# Patient Record
Sex: Female | Born: 1970 | Race: Black or African American | Hispanic: No | Marital: Single | Smoking: Never smoker
Health system: Southern US, Community
[De-identification: ages and names within clinical notes are randomized; demographics above are authoritative.]

## PROBLEM LIST (undated history)

## (undated) DIAGNOSIS — E559 Vitamin D deficiency, unspecified: Secondary | ICD-10-CM

## (undated) DIAGNOSIS — S2239XA Fracture of one rib, unspecified side, initial encounter for closed fracture: Secondary | ICD-10-CM

## (undated) DIAGNOSIS — J939 Pneumothorax, unspecified: Secondary | ICD-10-CM

## (undated) DIAGNOSIS — D241 Benign neoplasm of right breast: Secondary | ICD-10-CM

## (undated) HISTORY — PX: ABDOMINAL HYSTERECTOMY: SHX81

## (undated) HISTORY — PX: FRACTURE SURGERY: SHX138

## (undated) HISTORY — DX: Vitamin D deficiency, unspecified: E55.9

---

## 1998-05-13 ENCOUNTER — Encounter: Admission: RE | Admit: 1998-05-13 | Discharge: 1998-05-13 | Payer: Self-pay | Admitting: *Deleted

## 1999-08-11 ENCOUNTER — Emergency Department (HOSPITAL_COMMUNITY): Admission: EM | Admit: 1999-08-11 | Discharge: 1999-08-11 | Payer: Self-pay | Admitting: Emergency Medicine

## 1999-08-11 ENCOUNTER — Encounter: Payer: Self-pay | Admitting: Emergency Medicine

## 2000-01-19 ENCOUNTER — Other Ambulatory Visit: Admission: RE | Admit: 2000-01-19 | Discharge: 2000-01-19 | Payer: Self-pay | Admitting: *Deleted

## 2000-06-22 ENCOUNTER — Encounter: Payer: Self-pay | Admitting: Emergency Medicine

## 2000-06-22 ENCOUNTER — Emergency Department (HOSPITAL_COMMUNITY): Admission: EM | Admit: 2000-06-22 | Discharge: 2000-06-22 | Payer: Self-pay | Admitting: Emergency Medicine

## 2001-01-09 ENCOUNTER — Emergency Department (HOSPITAL_COMMUNITY): Admission: EM | Admit: 2001-01-09 | Discharge: 2001-01-09 | Payer: Self-pay | Admitting: Emergency Medicine

## 2003-05-23 ENCOUNTER — Other Ambulatory Visit: Admission: RE | Admit: 2003-05-23 | Discharge: 2003-05-23 | Payer: Self-pay | Admitting: Obstetrics and Gynecology

## 2003-06-18 ENCOUNTER — Emergency Department (HOSPITAL_COMMUNITY): Admission: EM | Admit: 2003-06-18 | Discharge: 2003-06-18 | Payer: Self-pay | Admitting: Emergency Medicine

## 2004-07-30 ENCOUNTER — Other Ambulatory Visit: Admission: RE | Admit: 2004-07-30 | Discharge: 2004-07-30 | Payer: Self-pay | Admitting: Obstetrics and Gynecology

## 2004-09-03 ENCOUNTER — Ambulatory Visit (HOSPITAL_COMMUNITY): Admission: RE | Admit: 2004-09-03 | Discharge: 2004-09-03 | Payer: Self-pay | Admitting: Obstetrics and Gynecology

## 2005-01-24 ENCOUNTER — Inpatient Hospital Stay (HOSPITAL_COMMUNITY): Admission: AD | Admit: 2005-01-24 | Discharge: 2005-01-26 | Payer: Self-pay | Admitting: Obstetrics and Gynecology

## 2006-01-17 ENCOUNTER — Other Ambulatory Visit: Admission: RE | Admit: 2006-01-17 | Discharge: 2006-01-17 | Payer: Self-pay | Admitting: Obstetrics and Gynecology

## 2010-06-10 ENCOUNTER — Emergency Department (HOSPITAL_COMMUNITY)
Admission: EM | Admit: 2010-06-10 | Discharge: 2010-06-10 | Payer: Self-pay | Source: Home / Self Care | Admitting: Emergency Medicine

## 2010-12-23 LAB — COMPREHENSIVE METABOLIC PANEL
AST: 17 U/L (ref 0–37)
Albumin: 3.7 g/dL (ref 3.5–5.2)
Alkaline Phosphatase: 59 U/L (ref 39–117)
Chloride: 107 mEq/L (ref 96–112)
GFR calc Af Amer: >60 mL/min (ref >60)
Potassium: 3.6 mEq/L (ref 3.5–5.1)
Total Bilirubin: 0.2 mg/dL — ABNORMAL LOW (ref 0.3–1.2)

## 2010-12-23 LAB — URINALYSIS, ROUTINE W REFLEX MICROSCOPIC
Bilirubin Urine: NEGATIVE
Glucose, UA: NEGATIVE mg/dL
Hgb urine dipstick: NEGATIVE
Ketones, ur: NEGATIVE mg/dL
Nitrite: NEGATIVE
Protein, ur: NEGATIVE mg/dL
Specific Gravity, Urine: 1.022 (ref 1.005–1.030)
Urobilinogen, UA: 1 mg/dL (ref 0.0–1.0)
pH: 5.5 (ref 5.0–8.0)

## 2010-12-23 LAB — URINE CULTURE: Colony Count: 7000

## 2010-12-23 LAB — DIFFERENTIAL
Basophils Relative: 0 % (ref 0–1)
Eosinophils Absolute: 0 10*3/uL (ref 0.0–0.7)
Lymphocytes Relative: 24 % (ref 12–46)
Monocytes Absolute: 0.5 10*3/uL (ref 0.1–1.0)
Neutro Abs: 4 10*3/uL (ref 1.7–7.7)

## 2010-12-23 LAB — WET PREP, GENITAL: Trich, Wet Prep: NONE SEEN

## 2010-12-23 LAB — CBC
Platelets: 192 10*3/uL (ref 150–400)
RBC: 4.01 MIL/uL (ref 3.87–5.11)
WBC: 5.9 10*3/uL (ref 4.0–10.5)

## 2010-12-23 LAB — HEMOCCULT GUIAC POC 1CARD (OFFICE): Fecal Occult Bld: NEGATIVE

## 2012-05-15 ENCOUNTER — Emergency Department: Payer: Self-pay | Admitting: Emergency Medicine

## 2012-05-15 LAB — MAGNESIUM: Magnesium: 1.8 mg/dL

## 2012-05-15 LAB — URINALYSIS, COMPLETE
Bilirubin,UR: NEGATIVE
Glucose,UR: NEGATIVE mg/dL (ref 0–75)
Ketone: NEGATIVE
Specific Gravity: 1.008 (ref 1.003–1.030)
Squamous Epithelial: 1
WBC UR: 1 /HPF (ref 0–5)

## 2012-05-15 LAB — COMPREHENSIVE METABOLIC PANEL
Bilirubin,Total: 0.2 mg/dL (ref 0.2–1.0)
Chloride: 108 mmol/L — ABNORMAL HIGH (ref 98–107)
Co2: 25 mmol/L (ref 21–32)
EGFR (African American): >60
EGFR (Non-African Amer.): >60
Osmolality: 279 (ref 275–301)
Potassium: 3.6 mmol/L (ref 3.5–5.1)

## 2012-05-15 LAB — CBC WITH DIFFERENTIAL/PLATELET
Basophil %: 0.5 %
Eosinophil %: 0.5 %
HGB: 12.8 g/dL (ref 12.0–16.0)
Lymphocyte #: 1.9 10*3/uL (ref 1.0–3.6)
MCH: 31 pg (ref 26.0–34.0)
MCV: 92 fL (ref 80–100)
Monocyte #: 0.4 x10 3/mm (ref 0.2–0.9)
RBC: 4.13 10*6/uL (ref 3.80–5.20)

## 2012-05-15 LAB — PREGNANCY, URINE: Pregnancy Test, Urine: NEGATIVE m[IU]/mL

## 2012-05-21 LAB — CULTURE, BLOOD (SINGLE)

## 2014-06-04 ENCOUNTER — Telehealth: Payer: Self-pay | Admitting: *Deleted

## 2014-06-04 NOTE — Telephone Encounter (Signed)
Patient calling because our name is listed on her Medicaid card.  Lost job and has been approved for [employer].  Currently seeing Dr. Donneta Romberg at [facility] [phone]).  Had heavy vaginal bleeding 1 week ago ("saturated my clothes") and recently had an abnormal endometrial bx.  Scheduled for ultrasound on 06/10/14 at OB/GYN's office.  Patient is wanting to know if we can authorize enough visits for her to be seen today and next Tuesday.  Called and spoke with Alma Friendly at [facility].  Authorized 3 visits (today, 06/10/14, and followup appt) and NPI # given.  Demographic info updated in Epic.  Kennyth Lose will mail patient new patient packet to establish care at [facility] and will schedule appt when patient returns packet to our office.  Patient informed.   Burna Forts, BSN, RN-BC

## 2014-06-10 ENCOUNTER — Ambulatory Visit: Payer: Self-pay | Admitting: Obstetrics and Gynecology

## 2014-07-30 ENCOUNTER — Ambulatory Visit: Payer: Self-pay | Admitting: Obstetrics and Gynecology

## 2014-07-30 LAB — COMPREHENSIVE METABOLIC PANEL
ALBUMIN: 3.1 g/dL — AB (ref 3.4–5.0)
ALK PHOS: 80 U/L
Anion Gap: 5 — ABNORMAL LOW (ref 7–16)
BILIRUBIN TOTAL: 0.2 mg/dL (ref 0.2–1.0)
BUN: 7 mg/dL (ref 7–18)
CHLORIDE: 107 mmol/L (ref 98–107)
CREATININE: 0.72 mg/dL (ref 0.60–1.30)
Calcium, Total: 7.8 mg/dL — ABNORMAL LOW (ref 8.5–10.1)
Co2: 27 mmol/L (ref 21–32)
GLUCOSE: 90 mg/dL (ref 65–99)
OSMOLALITY: 275 (ref 275–301)
Potassium: 4.2 mmol/L (ref 3.5–5.1)
SGOT(AST): 21 U/L (ref 15–37)
SGPT (ALT): 14 U/L
SODIUM: 139 mmol/L (ref 136–145)
TOTAL PROTEIN: 6.7 g/dL (ref 6.4–8.2)

## 2014-07-30 LAB — CBC
HCT: 34.1 % — AB (ref 35.0–47.0)
HGB: 10.8 g/dL — ABNORMAL LOW (ref 12.0–16.0)
MCH: 29.8 pg (ref 26.0–34.0)
MCHC: 31.7 g/dL — ABNORMAL LOW (ref 32.0–36.0)
MCV: 94 fL (ref 80–100)
PLATELETS: 195 10*3/uL (ref 150–440)
RBC: 3.63 10*6/uL — AB (ref 3.80–5.20)
RDW: 12.6 % (ref 11.5–14.5)
WBC: 8.5 10*3/uL (ref 3.6–11.0)

## 2014-08-05 ENCOUNTER — Inpatient Hospital Stay: Payer: Self-pay | Admitting: Obstetrics and Gynecology

## 2014-08-05 LAB — CBC
HCT: 30.7 % — AB (ref 35.0–47.0)
HGB: 9.8 g/dL — ABNORMAL LOW (ref 12.0–16.0)
MCH: 29.3 pg (ref 26.0–34.0)
MCHC: 31.8 g/dL — AB (ref 32.0–36.0)
MCV: 92 fL (ref 80–100)
PLATELETS: 154 10*3/uL (ref 150–440)
RBC: 3.34 10*6/uL — ABNORMAL LOW (ref 3.80–5.20)
RDW: 12 % (ref 11.5–14.5)
WBC: 14 10*3/uL — AB (ref 3.6–11.0)

## 2014-08-05 LAB — COMPREHENSIVE METABOLIC PANEL
ALBUMIN: 2.6 g/dL — AB (ref 3.4–5.0)
ALT: 12 U/L — AB
Alkaline Phosphatase: 60 U/L
Anion Gap: 6 — ABNORMAL LOW (ref 7–16)
BILIRUBIN TOTAL: 0.3 mg/dL (ref 0.2–1.0)
BUN: 6 mg/dL — ABNORMAL LOW (ref 7–18)
CALCIUM: 7.6 mg/dL — AB (ref 8.5–10.1)
CHLORIDE: 106 mmol/L (ref 98–107)
CREATININE: 0.76 mg/dL (ref 0.60–1.30)
Co2: 26 mmol/L (ref 21–32)
EGFR (African American): >60
GLUCOSE: 122 mg/dL — AB (ref 65–99)
OSMOLALITY: 275 (ref 275–301)
Potassium: 4 mmol/L (ref 3.5–5.1)
SGOT(AST): 18 U/L (ref 15–37)
Sodium: 138 mmol/L (ref 136–145)
Total Protein: 5.6 g/dL — ABNORMAL LOW (ref 6.4–8.2)

## 2014-08-06 LAB — BASIC METABOLIC PANEL
Anion Gap: 6 — ABNORMAL LOW (ref 7–16)
BUN: 4 mg/dL — AB (ref 7–18)
CO2: 28 mmol/L (ref 21–32)
Calcium, Total: 7.3 mg/dL — ABNORMAL LOW (ref 8.5–10.1)
Chloride: 108 mmol/L — ABNORMAL HIGH (ref 98–107)
Creatinine: 0.76 mg/dL (ref 0.60–1.30)
Glucose: 108 mg/dL — ABNORMAL HIGH (ref 65–99)
Osmolality: 281 (ref 275–301)
POTASSIUM: 3.6 mmol/L (ref 3.5–5.1)
SODIUM: 142 mmol/L (ref 136–145)

## 2014-08-06 LAB — CBC
HCT: 29.3 % — AB (ref 35.0–47.0)
HGB: 9.4 g/dL — ABNORMAL LOW (ref 12.0–16.0)
MCH: 30 pg (ref 26.0–34.0)
MCHC: 32.2 g/dL (ref 32.0–36.0)
MCV: 93 fL (ref 80–100)
Platelet: 134 10*3/uL — ABNORMAL LOW (ref 150–440)
RBC: 3.15 10*6/uL — AB (ref 3.80–5.20)
RDW: 12.2 % (ref 11.5–14.5)
WBC: 9.3 10*3/uL (ref 3.6–11.0)

## 2014-08-06 LAB — MAGNESIUM: MAGNESIUM: 1.6 mg/dL — AB

## 2014-08-07 LAB — BASIC METABOLIC PANEL
ANION GAP: 4 — AB (ref 7–16)
BUN: 4 mg/dL — AB (ref 7–18)
CHLORIDE: 107 mmol/L (ref 98–107)
CO2: 29 mmol/L (ref 21–32)
Calcium, Total: 7.4 mg/dL — ABNORMAL LOW (ref 8.5–10.1)
Creatinine: 0.85 mg/dL (ref 0.60–1.30)
GLUCOSE: 86 mg/dL (ref 65–99)
Osmolality: 276 (ref 275–301)
POTASSIUM: 3.3 mmol/L — AB (ref 3.5–5.1)
SODIUM: 140 mmol/L (ref 136–145)

## 2014-08-07 LAB — CBC
HCT: 26.9 % — AB (ref 35.0–47.0)
HGB: 8.7 g/dL — AB (ref 12.0–16.0)
MCH: 30.1 pg (ref 26.0–34.0)
MCHC: 32.2 g/dL (ref 32.0–36.0)
MCV: 94 fL (ref 80–100)
Platelet: 113 10*3/uL — ABNORMAL LOW (ref 150–440)
RBC: 2.87 10*6/uL — ABNORMAL LOW (ref 3.80–5.20)
RDW: 12.3 % (ref 11.5–14.5)
WBC: 6.8 10*3/uL (ref 3.6–11.0)

## 2014-08-07 LAB — MAGNESIUM: MAGNESIUM: 1.5 mg/dL — AB

## 2014-11-21 ENCOUNTER — Encounter (HOSPITAL_COMMUNITY): Payer: Self-pay

## 2014-11-21 ENCOUNTER — Emergency Department (HOSPITAL_COMMUNITY)
Admission: EM | Admit: 2014-11-21 | Discharge: 2014-11-21 | Discharge status: Against Medical Advice | Payer: Medicaid Other | Attending: Emergency Medicine | Admitting: Emergency Medicine

## 2014-11-21 DIAGNOSIS — K5900 Constipation, unspecified: Secondary | ICD-10-CM | POA: Insufficient documentation

## 2014-11-21 DIAGNOSIS — M542 Cervicalgia: Secondary | ICD-10-CM | POA: Diagnosis present

## 2014-11-21 HISTORY — DX: Fracture of one rib, unspecified side, initial encounter for closed fracture: S22.39XA

## 2014-11-21 NOTE — ED Notes (Signed)
Pt states she's had a migraine x 7 days.  Pt took 3 Advil yesterday.  Pt is ambulatory without difficulty or assistance.  No neuro deficits.

## 2014-11-21 NOTE — ED Notes (Signed)
Called for pt in lobby to go to room - no answer.

## 2014-11-22 NOTE — ED Provider Notes (Signed)
Pt not seen, left ED waiting room before being brought back.  Leone Brand, MD 11/22/14 [phone]

## 2015-01-31 NOTE — Op Note (Signed)
PATIENT NAME:  Cindy Carlson, Cindy Carlson MR#:  [id number] DATE OF BIRTH:  06-30-1971  DATE OF PROCEDURE:  08/05/2014  PREOPERATIVE DIAGNOSES: 1.  Abnormal uterine bleeding with irregular cycles.  2.  Fibroid uterus.   POSTOPERATIVE DIAGNOSES: 1.  Abnormal uterine bleeding with irregular cycles.  2.  Fibroid uterus.   PROCEDURES: 1.  Laparoscopic converted to total abdominal hysterectomy.  2.  Bilateral salpingectomy.   ANESTHESIA:  General.  SURGEON:  Will Bonnet, MD   ASSISTANT SURGEON:  Atlee Abide, MD   ESTIMATED BLOOD LOSS:  300 mL.   OPERATIVE FLUIDS:  1800 mL of crystalloid.   COMPLICATIONS:  None.   FINDINGS: 1.  Adhesion of omentum to the anterior abdominal wall, especially in the periumbilical region.  2.  Filmy adhesions of the small bowel to the anterior abdominal wall in the midline along prior surgical scar.  3.  Globally enlarged uterus.  4.  Approximately 7 cm posterior fundal fibroid, intramural.  5.  A 4 cm posterior lower uterine segment fibroid, intramural.  6.  Normal-appearing segments of fallopian tubes.  7.  Normal-appearing ovaries.  8.  Small tear in serosa of small bowel during adhesiolysis.  9.  A small left hernia at the entrance of the round ligament to the abdominal fascia approximately 0.75 cm in size with no intra-abdominal contents found in the hernia.   SPECIMENS: 1.  Uterus and cervix.  2.  Bilateral fallopian tubes.   CONDITION AT THE END OF PROCEDURE:  Stable.   PROCEDURE IN DETAIL:  The patient was taken to the operating room, where general anesthesia was administered and found to be adequate. The patient was placed in the dorsal supine lithotomy position in [place] with great care taken in positioning to minimize risk of damage to nerves and blood vessels. The patient was prepped and draped in the usual sterile fashion. After a timeout was called, a sterile speculum was placed in the vagina, and a single-tooth tenaculum was  used to grasp the anterior lip of the cervix. A large V-Care device was affixed to the cervix according to the manufacturer's recommendation after removal of the single-tooth tenaculum. An indwelling catheter was placed in her bladder at this point.   Attention was turned to the abdominal portion of the case, where after injection of local anesthetic, a 5 mm supraumbilical incision was made with a scalpel. Entry into the abdomen was gained using the Optiview trocar method. Verification of entry into the abdomen was accomplished using opening abdominal pressures. The abdomen was insufflated with CO2. The camera was introduced through the entry port, and verification of atraumatic entry was obtained. A left lower quadrant 5 mm port was placed under direct intra-abdominal camera visualization without difficulty. An 11 mm right lower quadrant port was placed under direct intra-abdominal camera visualization without difficulty. The camera was then moved to the left lower quadrant port to assess adhesions around the umbilicus given that there appeared to be some omentum nearby. It was noted that small bowel was traveling relatively close to the trocar. Further inspection revealed no damage to bowel. The camera was moved to the right lower quadrant port, and it was noted that the bowel had dense adhesions to the anterior abdominal wall near the umbilicus and that a particular segment of bowel appeared to be under great tension with the insufflation of the abdomen. Initial attempt was taken to reduce the adhesion well above any bowel. However, during this attempt, a small tear in the serosa  of the bowel was noted. General surgeon, Dr. Sherri Rad, was called in to the operating room to assess need for repair or to assess possibility of an enterotomy. Dr. Marina Gravel felt like there was a small serosal tear of no consequence and no repair would be needed. The rest of the case was then carried forward.   Attention was turned to  the ureters, which were both identified and found to be well away from the operative area of interest. The left utero-ovarian ligament was cauterized using bipolar electrocautery and transected using the Harmonic scalpel. This was carried forward to the round ligament. The anterior leaf of the broad ligament was transected using the Harmonic and down to the level of the internal os of the cervix. A bladder flap was then begun to be developed. Of note, there was a large amount of venous supply, especially to the left side of the uterus. The right utero-ovarian ligament was transected in a similar fashion and carried to the transection of the right round ligament. The anterior leaf of the broad ligament was transected in a similar fashion. There was difficulty in visualization given the large posterior fundal fibroid, so a 30-degree angled scope was used to gain better visualization. The posterior broad leaf was then transected down to the level of the internal os, and the right uterine artery was attempted to be skeletonized and ligated using bipolar electrocautery; however, there was difficulty in gaining adequate visualization and gaining hemostasis. At this point, decision was made to complete the rest of the surgery via open laparotomy given location of the lower uterine segment fibroid in relation to the internal cervical os and concern with a large amount of vasculature in the area and potential for large blood loss laparoscopically.   A Pfannenstiel incision was made with a scalpel and carried through the various layers until the peritoneum was identified and opened sharply. This was done while the abdomen remained insufflated. The uterus served as a protector of the bowel so the bowel was able to maintain retraction from the operative field of interest without a self-retaining retractor. The right uterine artery at this point was better skeletonized. The bladder flap was created more completely. In  sequential steps, the right uterine artery was skeletonized, transected, and in sequential clamp cut and tie technique, the vessels along the lower uterine segment and the cervix were ligated and transected. Curved clamps were placed across the lower edge of the cervix and using Jorgenson scissors, the uterus, cervix, and fibroids were then liberated from their connection into the vagina, and the uterus was removed from the abdomen. The cervical cuff was closed using interrupted figure-of-eight sutures with 0 Vicryl with hemostasis verified. The vascular pedicles were verified to be hemostatic. The abdomen was monitored for several minutes to ensure that no additional bleeding would occur, and no additional bleeding did occur. The segments of fallopian tubes that were remaining were then ligated using the Harmonic, and the pedicles along the mesosalpinx were verified to be hemostatic.   The pelvis was copiously irrigated using normal saline at body temperature. The peritoneum was closed using #0 Vicryl in a running fashion.   The On-Q catheters were placed according to the manufacturer's recommendations. They were situated approximately 4 cm cephalad to the incision line, approximately 1 cm apart, straddling the midline. They were inserted to a depth of approximately the third marking on the catheter and positioned just superficial to the rectus abdominis muscles and just deep to the rectus fascia.  After verification of hemostasis of the rectus abdominis muscles, the rectus fascia was closed using #0 Maxon with two separate sutures each, each starting at the lateral apices and meeting in the midline, where they were tied together. For minimization of tension along the skin closure, 3-0 Vicryl stitches were thrown, one in the midline, one between the midline and the left lateral apex, and one midway between the midline and the right lateral apex. The skin was closed using 4-0 Monocryl in a subcuticular fashion.  The On-Q catheters were affixed to the skin using Dermabond as well as Steri-Strips and Tegaderm. All trocars had been removed at this point. Prior to removal of ports verification of no bowel contents entering the port sites was achieved. None could be verified. The right lower quadrant 11 mm port skin site was closed using 4-0 Vicryl in a subcuticular fashion. All skin sites were then reapproximated using Dermabond.   The patient tolerated the procedure well. Sponge, lap, and needle counts were correct x 2. For VTE prophylaxis, the patient was wearing pneumatic compression stockings throughout the entire procedure. For antibiotic prophylaxis, she received 2 grams of Ancef prior to skin incision. The patient was awakened in the OR and transferred to the recovery area in stable condition.    ____________________________ Will Bonnet, MD sdj:nb D: 08/05/2014 19:42:18 ET T: 08/06/2014 06:03:53 ET JOB#: [id number]  cc: Will Bonnet, MD, <Dictator> Will Bonnet MD ELECTRONICALLY SIGNED 08/21/2014 11:04

## 2015-02-02 LAB — SURGICAL PATHOLOGY

## 2015-04-11 ENCOUNTER — Emergency Department (HOSPITAL_COMMUNITY): Payer: No Typology Code available for payment source

## 2015-04-11 ENCOUNTER — Encounter (HOSPITAL_COMMUNITY): Payer: Self-pay | Admitting: *Deleted

## 2015-04-11 ENCOUNTER — Emergency Department (HOSPITAL_COMMUNITY)
Admission: EM | Admit: 2015-04-11 | Discharge: 2015-04-11 | Disposition: A | Discharge status: Home / Self Care | Payer: No Typology Code available for payment source | Attending: Emergency Medicine | Admitting: Emergency Medicine

## 2015-04-11 DIAGNOSIS — N8320 Unspecified ovarian cysts: Secondary | ICD-10-CM | POA: Insufficient documentation

## 2015-04-11 DIAGNOSIS — S0990XA Unspecified injury of head, initial encounter: Secondary | ICD-10-CM | POA: Diagnosis not present

## 2015-04-11 DIAGNOSIS — Z8781 Personal history of (healed) traumatic fracture: Secondary | ICD-10-CM | POA: Insufficient documentation

## 2015-04-11 DIAGNOSIS — R51 Headache: Secondary | ICD-10-CM

## 2015-04-11 DIAGNOSIS — S3991XA Unspecified injury of abdomen, initial encounter: Secondary | ICD-10-CM | POA: Insufficient documentation

## 2015-04-11 DIAGNOSIS — Y92410 Unspecified street and highway as the place of occurrence of the external cause: Secondary | ICD-10-CM | POA: Diagnosis not present

## 2015-04-11 DIAGNOSIS — S20219A Contusion of unspecified front wall of thorax, initial encounter: Secondary | ICD-10-CM | POA: Diagnosis not present

## 2015-04-11 DIAGNOSIS — R1030 Lower abdominal pain, unspecified: Secondary | ICD-10-CM

## 2015-04-11 DIAGNOSIS — R519 Headache, unspecified: Secondary | ICD-10-CM

## 2015-04-11 DIAGNOSIS — N83209 Unspecified ovarian cyst, unspecified side: Secondary | ICD-10-CM

## 2015-04-11 DIAGNOSIS — S299XXA Unspecified injury of thorax, initial encounter: Secondary | ICD-10-CM | POA: Diagnosis present

## 2015-04-11 DIAGNOSIS — Y9389 Activity, other specified: Secondary | ICD-10-CM | POA: Insufficient documentation

## 2015-04-11 DIAGNOSIS — Y998 Other external cause status: Secondary | ICD-10-CM | POA: Diagnosis not present

## 2015-04-11 LAB — CBC WITH DIFFERENTIAL/PLATELET
Basophils Absolute: 0 10*3/uL (ref 0.0–0.1)
Basophils Relative: 0 % (ref 0–1)
EOS ABS: 0 10*3/uL (ref 0.0–0.7)
EOS PCT: 0 % (ref 0–5)
HEMATOCRIT: 39.7 % (ref 36.0–46.0)
HEMOGLOBIN: 13.1 g/dL (ref 12.0–15.0)
LYMPHS PCT: 31 % (ref 12–46)
Lymphs Abs: 2.2 10*3/uL (ref 0.7–4.0)
MCH: 29.8 pg (ref 26.0–34.0)
MCHC: 33 g/dL (ref 30.0–36.0)
MCV: 90.2 fL (ref 78.0–100.0)
Monocytes Absolute: 0.5 10*3/uL (ref 0.1–1.0)
Monocytes Relative: 6 % (ref 3–12)
NEUTROS ABS: 4.4 10*3/uL (ref 1.7–7.7)
Neutrophils Relative %: 63 % (ref 43–77)
PLATELETS: 166 10*3/uL (ref 150–400)
RBC: 4.4 MIL/uL (ref 3.87–5.11)
RDW: 12.9 % (ref 11.5–15.5)
WBC: 7 10*3/uL (ref 4.0–10.5)

## 2015-04-11 LAB — I-STAT CHEM 8, ED
BUN: 8 mg/dL (ref 6–20)
Calcium, Ion: 1.21 mmol/L (ref 1.12–1.23)
Chloride: 103 mmol/L (ref 101–111)
Creatinine, Ser: 0.8 mg/dL (ref 0.44–1.00)
Glucose, Bld: 86 mg/dL (ref 65–99)
HCT: 43 % (ref 36.0–46.0)
Hemoglobin: 14.6 g/dL (ref 12.0–15.0)
POTASSIUM: 4 mmol/L (ref 3.5–5.1)
SODIUM: 138 mmol/L (ref 135–145)
TCO2: 24 mmol/L (ref 0–100)

## 2015-04-11 MED ORDER — NAPROXEN 500 MG PO TABS: 500.0000 mg | ORAL_TABLET | Freq: Two times a day (BID) | ORAL | Status: DC

## 2015-04-11 MED ORDER — IOHEXOL 300 MG/ML  SOLN
100.0000 mL | Freq: Once | INTRAMUSCULAR | Status: AC | PRN
  Administered 2015-04-11: 100 mL via INTRAVENOUS

## 2015-04-11 MED ORDER — KETOROLAC TROMETHAMINE 30 MG/ML IJ SOLN
30.0000 mg | Freq: Once | INTRAMUSCULAR | Status: AC
  Administered 2015-04-11: 30 mg via INTRAVENOUS
  Filled 2015-04-11: qty 1
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
## 2015-04-11 NOTE — ED Notes (Signed)
Pt transporting to xray and CT.
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
## 2016-01-30 ENCOUNTER — Emergency Department
Admission: EM | Admit: 2016-01-30 | Discharge: 2016-01-30 | Disposition: A | Discharge status: Home / Self Care | Payer: Commercial Managed Care - PPO | Attending: Emergency Medicine | Admitting: Emergency Medicine

## 2016-01-30 ENCOUNTER — Emergency Department: Payer: Commercial Managed Care - PPO

## 2016-01-30 DIAGNOSIS — T148 Other injury of unspecified body region: Secondary | ICD-10-CM | POA: Diagnosis not present

## 2016-01-30 DIAGNOSIS — Y939 Activity, unspecified: Secondary | ICD-10-CM | POA: Diagnosis not present

## 2016-01-30 DIAGNOSIS — Y92410 Unspecified street and highway as the place of occurrence of the external cause: Secondary | ICD-10-CM | POA: Diagnosis not present

## 2016-01-30 DIAGNOSIS — Y999 Unspecified external cause status: Secondary | ICD-10-CM | POA: Diagnosis not present

## 2016-01-30 DIAGNOSIS — T148XXA Other injury of unspecified body region, initial encounter: Secondary | ICD-10-CM

## 2016-01-30 DIAGNOSIS — M25562 Pain in left knee: Secondary | ICD-10-CM | POA: Diagnosis present

## 2016-01-30 HISTORY — DX: Pneumothorax, unspecified: J93.9

## 2016-01-30 LAB — CBC WITH DIFFERENTIAL/PLATELET
Basophils Absolute: 0 10*3/uL (ref 0–0.1)
Basophils Relative: 1 %
EOS PCT: 0 %
Eosinophils Absolute: 0 10*3/uL (ref 0–0.7)
HCT: 39.6 % (ref 35.0–47.0)
Hemoglobin: 13.2 g/dL (ref 12.0–16.0)
LYMPHS ABS: 1.3 10*3/uL (ref 1.0–3.6)
LYMPHS PCT: 23 %
MCH: 30.8 pg (ref 26.0–34.0)
MCHC: 33.3 g/dL (ref 32.0–36.0)
MCV: 92.3 fL (ref 80.0–100.0)
MONO ABS: 0.4 10*3/uL (ref 0.2–0.9)
Monocytes Relative: 7 %
Neutro Abs: 4.1 10*3/uL (ref 1.4–6.5)
Neutrophils Relative %: 69 %
Platelets: 152 10*3/uL (ref 150–440)
RBC: 4.29 MIL/uL (ref 3.80–5.20)
RDW: 13.4 % (ref 11.5–14.5)
WBC: 5.8 10*3/uL (ref 3.6–11.0)

## 2016-01-30 LAB — COMPREHENSIVE METABOLIC PANEL
ALK PHOS: 59 U/L (ref 38–126)
ALT: 24 U/L (ref 14–54)
AST: 23 U/L (ref 15–41)
Albumin: 4 g/dL (ref 3.5–5.0)
Anion gap: 4 — ABNORMAL LOW (ref 5–15)
BUN: 14 mg/dL (ref 6–20)
CALCIUM: 8.9 mg/dL (ref 8.9–10.3)
CHLORIDE: 107 mmol/L (ref 101–111)
CO2: 27 mmol/L (ref 22–32)
CREATININE: 0.83 mg/dL (ref 0.44–1.00)
GFR calc non Af Amer: >60 mL/min (ref >60)
GLUCOSE: 98 mg/dL (ref 65–99)
Potassium: 4.6 mmol/L (ref 3.5–5.1)
Sodium: 138 mmol/L (ref 135–145)
Total Bilirubin: 0.5 mg/dL (ref 0.3–1.2)
Total Protein: 6.9 g/dL (ref 6.5–8.1)

## 2016-01-30 LAB — HCG, QUANTITATIVE, PREGNANCY: hCG, Beta Chain, Quant, S: <1 m[IU]/mL (ref <5)

## 2016-01-30 MED ORDER — KETOROLAC TROMETHAMINE 30 MG/ML IJ SOLN
30.0000 mg | Freq: Once | INTRAMUSCULAR | Status: AC
  Administered 2016-01-30: 30 mg via INTRAVENOUS
  Filled 2016-01-30: qty 1

## 2016-01-30 MED ORDER — IOPAMIDOL (ISOVUE-300) INJECTION 61%
100.0000 mL | Freq: Once | INTRAVENOUS | Status: AC | PRN
  Administered 2016-01-30: 100 mL via INTRAVENOUS

## 2016-01-30 MED ORDER — DIAZEPAM 5 MG/ML IJ SOLN
5.0000 mg | Freq: Once | INTRAMUSCULAR | Status: AC
  Administered 2016-01-30: 5 mg via INTRAVENOUS
  Filled 2016-01-30: qty 2

## 2016-01-30 MED ORDER — CYCLOBENZAPRINE HCL 10 MG PO TABS: 10.0000 mg | ORAL_TABLET | Freq: Three times a day (TID) | ORAL | Status: AC | PRN

## 2016-01-30 MED ORDER — IBUPROFEN 800 MG PO TABS: 800.0000 mg | ORAL_TABLET | Freq: Three times a day (TID) | ORAL | Status: DC | PRN

## 2016-01-30 NOTE — ED Notes (Signed)
Report to Spencer, RN
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
## 2016-01-30 NOTE — Discharge Instructions (Signed)

## 2016-03-29 ENCOUNTER — Ambulatory Visit (INDEPENDENT_AMBULATORY_CARE_PROVIDER_SITE_OTHER): Payer: Commercial Managed Care - PPO | Admitting: Emergency Medicine

## 2016-03-29 VITALS — BP 102/68 | HR 110 | Temp 98.8°F | Resp 18 | Ht 66.0 in | Wt 135.0 lb

## 2016-03-29 DIAGNOSIS — J029 Acute pharyngitis, unspecified: Secondary | ICD-10-CM

## 2016-03-29 LAB — POCT RAPID STREP A (OFFICE): Rapid Strep A Screen: NEGATIVE

## 2016-03-29 MED ORDER — FIRST-DUKES MOUTHWASH MT SUSP: OROMUCOSAL | Status: DC

## 2016-03-29 MED ORDER — AMOXICILLIN 875 MG PO TABS: 875.0000 mg | ORAL_TABLET | Freq: Two times a day (BID) | ORAL | Status: DC
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
## 2016-03-29 NOTE — Patient Instructions (Addendum)
     IF you received an x-ray today, you will receive an invoice from [facility]. Please contact [facility] at [phone] with questions or concerns regarding your invoice.   IF you received labwork today, you will receive an invoice from [employer]. Please contact [facility] at [phone] with questions or concerns regarding your invoice.   Our billing staff will not be able to assist you with questions regarding bills from these companies.  You will be contacted with the lab results as soon as they are available. The fastest way to get your results is to activate your My Chart account. Instructions are located on the last page of this paperwork. If you have not heard from [place] regarding the results in 2 weeks, please contact this office.     Sore throatSore Throat A sore throat is pain, burning, irritation, or scratchiness of the throat. There is often pain or tenderness when swallowing or talking. A sore throat may be accompanied by other symptoms, such as coughing, sneezing, fever, and swollen neck glands. A sore throat is often the first sign of another sickness, such as a cold, flu, strep throat, or mononucleosis (commonly known as mono). Most sore throats go away without medical treatment. CAUSES  The most common causes of a sore throat include:  A viral infection, such as a cold, flu, or mono.  A bacterial infection, such as strep throat, tonsillitis, or whooping cough.  Seasonal allergies.  Dryness in the air.  Irritants, such as smoke or pollution.  Gastroesophageal reflux disease (GERD). HOME CARE INSTRUCTIONS   Only take over-the-counter medicines as directed by your caregiver.  Drink enough fluids to keep your urine clear or pale yellow.  Rest as needed.  Try using throat sprays, lozenges, or sucking on hard candy to ease any pain (if older than 4 years or as directed).  Sip warm liquids, such as broth, herbal tea, or  warm water with honey to relieve pain temporarily. You may also eat or drink cold or frozen liquids such as frozen ice pops.  Gargle with salt water (mix 1 tsp salt with 8 oz of water).  Do not smoke and avoid secondhand smoke.  Put a cool-mist humidifier in your bedroom at night to moisten the air. You can also turn on a hot shower and sit in the bathroom with the door closed for 5-10 minutes. SEEK IMMEDIATE MEDICAL CARE IF:  You have difficulty breathing.  You are unable to swallow fluids, soft foods, or your saliva.  You have increased swelling in the throat.  Your sore throat does not get better in 7 days.  You have nausea and vomiting.  You have a fever or persistent symptoms for more than 2-3 days.  You have a fever and your symptoms suddenly get worse. MAKE SURE YOU:   Understand these instructions.  Will watch your condition.  Will get help right away if you are not doing well or get worse.   This information is not intended to replace advice given to you by your health care provider. Make sure you discuss any questions you have with your health care provider.   Document Released: 11/03/2004 Document Revised: 10/17/2014 Document Reviewed: 06/03/2012 Elsevier Interactive Patient Education [employer].

## 2016-03-31 LAB — CULTURE, GROUP A STREP: ORGANISM ID, BACTERIA: NORMAL

## 2016-05-23 ENCOUNTER — Other Ambulatory Visit: Payer: Self-pay | Admitting: Obstetrics and Gynecology

## 2016-05-23 DIAGNOSIS — Z803 Family history of malignant neoplasm of breast: Secondary | ICD-10-CM

## 2016-05-23 DIAGNOSIS — N644 Mastodynia: Secondary | ICD-10-CM

## 2016-05-25 ENCOUNTER — Other Ambulatory Visit: Payer: Self-pay | Admitting: *Deleted

## 2016-05-25 ENCOUNTER — Inpatient Hospital Stay
Admission: RE | Admit: 2016-05-25 | Discharge: 2016-05-25 | Disposition: A | Discharge status: Home / Self Care | Payer: Self-pay | Source: Ambulatory Visit | Attending: *Deleted | Admitting: *Deleted

## 2016-05-25 DIAGNOSIS — Z9289 Personal history of other medical treatment: Secondary | ICD-10-CM

## 2016-06-08 ENCOUNTER — Other Ambulatory Visit: Payer: Commercial Managed Care - PPO

## 2016-06-08 ENCOUNTER — Ambulatory Visit: Payer: Commercial Managed Care - PPO

## 2016-06-27 ENCOUNTER — Other Ambulatory Visit: Payer: Commercial Managed Care - PPO

## 2016-06-27 ENCOUNTER — Ambulatory Visit: Payer: Commercial Managed Care - PPO | Attending: Obstetrics and Gynecology

## 2016-12-21 ENCOUNTER — Other Ambulatory Visit: Payer: Self-pay | Admitting: Obstetrics and Gynecology

## 2016-12-21 ENCOUNTER — Encounter: Payer: Self-pay | Admitting: Obstetrics and Gynecology

## 2016-12-21 DIAGNOSIS — E559 Vitamin D deficiency, unspecified: Secondary | ICD-10-CM

## 2016-12-21 HISTORY — DX: Vitamin D deficiency, unspecified: E55.9

## 2016-12-26 ENCOUNTER — Ambulatory Visit
Admission: RE | Admit: 2016-12-26 | Discharge: 2016-12-26 | Disposition: A | Discharge status: Home / Self Care | Payer: Commercial Managed Care - PPO | Source: Ambulatory Visit | Attending: Obstetrics and Gynecology | Admitting: Obstetrics and Gynecology

## 2016-12-26 DIAGNOSIS — N644 Mastodynia: Secondary | ICD-10-CM

## 2016-12-26 DIAGNOSIS — Z803 Family history of malignant neoplasm of breast: Secondary | ICD-10-CM | POA: Insufficient documentation

## 2017-07-28 ENCOUNTER — Ambulatory Visit (INDEPENDENT_AMBULATORY_CARE_PROVIDER_SITE_OTHER): Payer: Commercial Managed Care - PPO | Admitting: Obstetrics and Gynecology

## 2017-07-28 ENCOUNTER — Encounter: Payer: Self-pay | Admitting: Obstetrics and Gynecology

## 2017-07-28 VITALS — BP 110/66 | HR 89 | Wt 134.0 lb

## 2017-07-28 DIAGNOSIS — N6315 Unspecified lump in the right breast, overlapping quadrants: Secondary | ICD-10-CM

## 2017-07-28 DIAGNOSIS — N6310 Unspecified lump in the right breast, unspecified quadrant: Secondary | ICD-10-CM

## 2017-07-28 NOTE — Progress Notes (Signed)
Obstetrics & Gynecology Office Visit   Chief Complaint:  Chief Complaint  Patient presents with  . breast exam    breast knot right side x few days    History of Present Illness: Breast Mass: Patient presents for evaluation of a breast mass. Change was noted 5 days ago. Patient does routinely do self breast exams.  Patient has noted a change on breast exam. Breast cancer risk factors include family hx on mother's side.   Age of menarche was 46. Patient denies hormonal therapy. Patient is [id number]. Age of first live birth was 46 years. Patient did breast feed. Patient denies nipple discharge. Patient denies to previous breast biopsy. Patient denies a personal history of breast cancer.  Past Medical History:  Diagnosis Date  . Pneumothorax   . Rib fracture   . Vitamin D deficiency 12/21/2016    Past Surgical History:  Procedure Laterality Date  . ABDOMINAL HYSTERECTOMY    . FRACTURE SURGERY      Gynecologic History: No LMP recorded. Patient has had a hysterectomy.  Obstetric History: No obstetric history on file.  Family History  Problem Relation Age of Onset  . Breast cancer Mother 83    Social History   Social History  . Marital status: Single    Spouse name: N/A  . Number of children: N/A  . Years of education: N/A   Occupational History  . Not on file.   Social History Main Topics  . Smoking status: Never Smoker  . Smokeless tobacco: Never Used  . Alcohol use No  . Drug use: No  . Sexual activity: Not Currently    Birth control/ protection: Surgical   Other Topics Concern  . Not on file   Social History Narrative  . No narrative on file    Allergies  Allergen Reactions  . Milk-Related Compounds     Prior to Admission medications   Medication Sig Start Date End Date Taking? Authorizing Provider  EPINEPHrine 0.3 mg/0.3 mL IJ SOAJ injection Inject 0.3 mg into the skin. 04/27/15  Yes [provider]    Review of Systems  Constitutional:  Negative for chills, diaphoresis, fever, malaise/fatigue and weight loss.  Respiratory: Negative for cough, hemoptysis, sputum production, shortness of breath and wheezing.   Cardiovascular: Positive for chest pain (at breast lump). Negative for palpitations, orthopnea, claudication, leg swelling and PND.  Gastrointestinal: Negative for abdominal pain, blood in stool, constipation, diarrhea, heartburn, melena and nausea.  Skin: Negative for itching and rash.  Neurological: Negative for dizziness, tingling, sensory change, speech change, focal weakness, seizures, loss of consciousness, weakness and headaches.     Physical Exam BP 110/66 (BP Location: Left Arm, Patient Position: Sitting, Cuff Size: Normal)   Pulse 89   Wt 134 lb (60.8 kg)   BMI 21.63 kg/m  No LMP recorded. Patient has had a hysterectomy. Physical Exam  Constitutional: She is oriented to person, place, and time. She appears well-developed and well-nourished. No distress.  HENT:  Head: Normocephalic and atraumatic.  Eyes: Conjunctivae are normal. No scleral icterus.  Cardiovascular: Normal rate, regular rhythm and normal heart sounds.   Pulmonary/Chest: Effort normal and breath sounds normal. No respiratory distress. She has no wheezes. She has no rales. Right breast exhibits mass (at 3 o'clock on right breast, 3 cm from right nipple, it is about 0.5 x 1 cm, mobile, tender, borders regular) and tenderness (over lump and axilla). Right breast exhibits no inverted nipple, no nipple discharge and no skin change. Left  breast exhibits tenderness (over axilla). Left breast exhibits no inverted nipple, no mass, no nipple discharge and no skin change.    Abdominal: Soft. Bowel sounds are normal.  Musculoskeletal: Normal range of motion. She exhibits no edema.  Neurological: She is alert and oriented to person, place, and time. No cranial nerve deficit.  Skin: Skin is warm and dry. No rash noted. No erythema.  Psychiatric: She has a  normal mood and affect. Her behavior is normal. Judgment normal.   Female chaperone present for pelvic and breast  portions of the physical exam  Assessment: 46 y.o. No obstetric history on file. female here for  1. Breast lump on right side at 3 o'clock position      Plan: Problem List Items Addressed This Visit    None    Visit Diagnoses    Breast lump on right side at 3 o'clock position    -  Primary   Relevant Orders   MM DIAG BREAST TOMO UNI RIGHT   US BREAST LTD UNI RIGHT INC AXILLA     Breast imaging ordered for stat.  Characteristics are overall benign. However, patient's mother had breast cancer at age 44y.  Her last breast imaging was 7 months ago and was normal.    [name], MD 07/28/2017 3:10 PM

## 2017-07-31 ENCOUNTER — Other Ambulatory Visit: Payer: Commercial Managed Care - PPO

## 2017-08-03 ENCOUNTER — Other Ambulatory Visit: Payer: Commercial Managed Care - PPO

## 2017-08-04 ENCOUNTER — Other Ambulatory Visit: Payer: Self-pay

## 2017-08-04 ENCOUNTER — Encounter: Payer: Self-pay | Admitting: Obstetrics and Gynecology

## 2017-08-07 ENCOUNTER — Other Ambulatory Visit: Payer: Self-pay | Admitting: Obstetrics and Gynecology

## 2017-08-07 DIAGNOSIS — N63 Unspecified lump in unspecified breast: Secondary | ICD-10-CM

## 2017-08-17 ENCOUNTER — Other Ambulatory Visit: Payer: Self-pay | Admitting: Obstetrics and Gynecology

## 2017-08-17 ENCOUNTER — Inpatient Hospital Stay: Admission: RE | Admit: 2017-08-17 | Payer: Commercial Managed Care - PPO | Source: Ambulatory Visit

## 2017-08-17 ENCOUNTER — Ambulatory Visit
Admission: RE | Admit: 2017-08-17 | Discharge: 2017-08-17 | Disposition: A | Discharge status: Home / Self Care | Payer: Commercial Managed Care - PPO | Source: Ambulatory Visit | Attending: Obstetrics and Gynecology | Admitting: Obstetrics and Gynecology

## 2017-08-17 DIAGNOSIS — N63 Unspecified lump in unspecified breast: Secondary | ICD-10-CM

## 2017-08-21 ENCOUNTER — Ambulatory Visit
Admission: RE | Admit: 2017-08-21 | Discharge: 2017-08-21 | Disposition: A | Discharge status: Home / Self Care | Payer: Commercial Managed Care - PPO | Source: Ambulatory Visit | Attending: Obstetrics and Gynecology | Admitting: Obstetrics and Gynecology

## 2017-08-21 DIAGNOSIS — N63 Unspecified lump in unspecified breast: Secondary | ICD-10-CM

## 2017-09-06 ENCOUNTER — Other Ambulatory Visit: Payer: Self-pay | Admitting: General Surgery

## 2017-09-06 DIAGNOSIS — D241 Benign neoplasm of right breast: Secondary | ICD-10-CM

## 2017-09-08 ENCOUNTER — Other Ambulatory Visit: Payer: Self-pay | Admitting: General Surgery

## 2017-09-08 DIAGNOSIS — D241 Benign neoplasm of right breast: Secondary | ICD-10-CM

## 2017-09-14 ENCOUNTER — Encounter (HOSPITAL_BASED_OUTPATIENT_CLINIC_OR_DEPARTMENT_OTHER): Payer: Self-pay | Admitting: *Deleted
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
## 2017-09-20 ENCOUNTER — Inpatient Hospital Stay: Admission: RE | Admit: 2017-09-20 | Payer: Commercial Managed Care - PPO | Source: Ambulatory Visit

## 2017-09-20 ENCOUNTER — Other Ambulatory Visit: Payer: Self-pay | Admitting: General Surgery

## 2017-09-20 ENCOUNTER — Ambulatory Visit
Admission: RE | Admit: 2017-09-20 | Discharge: 2017-09-20 | Disposition: A | Discharge status: Home / Self Care | Payer: Commercial Managed Care - PPO | Source: Ambulatory Visit | Attending: General Surgery | Admitting: General Surgery

## 2017-09-20 DIAGNOSIS — D241 Benign neoplasm of right breast: Secondary | ICD-10-CM

## 2017-09-20 NOTE — Progress Notes (Signed)
Ensure pre surgery drink given with instructions to complete by 0800 dos, pt verbalized understanding. 

## 2017-09-22 ENCOUNTER — Ambulatory Visit (HOSPITAL_BASED_OUTPATIENT_CLINIC_OR_DEPARTMENT_OTHER)
Admission: RE | Admit: 2017-09-22 | Discharge: 2017-09-22 | Disposition: A | Discharge status: Home / Self Care | Payer: Commercial Managed Care - PPO | Source: Ambulatory Visit | Attending: General Surgery | Admitting: General Surgery

## 2017-09-22 ENCOUNTER — Ambulatory Visit (HOSPITAL_BASED_OUTPATIENT_CLINIC_OR_DEPARTMENT_OTHER): Payer: Commercial Managed Care - PPO | Admitting: Anesthesiology

## 2017-09-22 ENCOUNTER — Encounter (HOSPITAL_BASED_OUTPATIENT_CLINIC_OR_DEPARTMENT_OTHER)
Admission: RE | Disposition: A | Discharge status: Home / Self Care | Payer: Self-pay | Source: Ambulatory Visit | Attending: General Surgery

## 2017-09-22 ENCOUNTER — Encounter (HOSPITAL_BASED_OUTPATIENT_CLINIC_OR_DEPARTMENT_OTHER): Payer: Self-pay | Admitting: *Deleted

## 2017-09-22 ENCOUNTER — Other Ambulatory Visit: Payer: Self-pay

## 2017-09-22 ENCOUNTER — Ambulatory Visit
Admission: RE | Admit: 2017-09-22 | Discharge: 2017-09-22 | Disposition: A | Discharge status: Home / Self Care | Payer: Commercial Managed Care - PPO | Source: Ambulatory Visit | Attending: General Surgery | Admitting: General Surgery

## 2017-09-22 DIAGNOSIS — Z800 Family history of malignant neoplasm of digestive organs: Secondary | ICD-10-CM | POA: Insufficient documentation

## 2017-09-22 DIAGNOSIS — Z803 Family history of malignant neoplasm of breast: Secondary | ICD-10-CM | POA: Diagnosis not present

## 2017-09-22 DIAGNOSIS — D241 Benign neoplasm of right breast: Secondary | ICD-10-CM

## 2017-09-22 HISTORY — DX: Benign neoplasm of right breast: D24.1

## 2017-09-22 HISTORY — PX: BREAST LUMPECTOMY WITH RADIOACTIVE SEED LOCALIZATION: SHX6424

## 2017-09-22 SURGERY — BREAST LUMPECTOMY WITH RADIOACTIVE SEED LOCALIZATION
Anesthesia: General | Site: Breast | Laterality: Right

## 2017-09-22 MED ORDER — CHLORHEXIDINE GLUCONATE CLOTH 2 % EX PADS: 6.0000 | MEDICATED_PAD | Freq: Once | CUTANEOUS | Status: DC

## 2017-09-22 MED ORDER — LIDOCAINE-EPINEPHRINE 1 %-1:100000 IJ SOLN
INTRAMUSCULAR | Status: AC
  Filled 2017-09-22: qty 1

## 2017-09-22 MED ORDER — GABAPENTIN 300 MG PO CAPS
ORAL_CAPSULE | ORAL | Status: AC
  Filled 2017-09-22: qty 1

## 2017-09-22 MED ORDER — CEFAZOLIN SODIUM-DEXTROSE 2-4 GM/100ML-% IV SOLN
INTRAVENOUS | Status: AC
  Filled 2017-09-22: qty 100

## 2017-09-22 MED ORDER — ACETAMINOPHEN 500 MG PO TABS
ORAL_TABLET | ORAL | Status: AC
  Filled 2017-09-22: qty 2

## 2017-09-22 MED ORDER — FENTANYL CITRATE (PF) 100 MCG/2ML IJ SOLN
INTRAMUSCULAR | Status: AC
  Filled 2017-09-22: qty 2

## 2017-09-22 MED ORDER — ONDANSETRON HCL 4 MG/2ML IJ SOLN
INTRAMUSCULAR | Status: DC | PRN
  Administered 2017-09-22: 4 mg via INTRAVENOUS

## 2017-09-22 MED ORDER — BUPIVACAINE-EPINEPHRINE (PF) 0.5% -1:200000 IJ SOLN
INTRAMUSCULAR | Status: DC | PRN
  Administered 2017-09-22: 6 mL

## 2017-09-22 MED ORDER — GABAPENTIN 300 MG PO CAPS
300.0000 mg | ORAL_CAPSULE | ORAL | Status: AC
  Administered 2017-09-22: 300 mg via ORAL

## 2017-09-22 MED ORDER — MIDAZOLAM HCL 2 MG/2ML IJ SOLN
INTRAMUSCULAR | Status: AC
  Filled 2017-09-22: qty 2

## 2017-09-22 MED ORDER — DEXAMETHASONE SODIUM PHOSPHATE 10 MG/ML IJ SOLN
INTRAMUSCULAR | Status: AC
  Filled 2017-09-22: qty 1

## 2017-09-22 MED ORDER — FENTANYL CITRATE (PF) 100 MCG/2ML IJ SOLN
50.0000 ug | INTRAMUSCULAR | Status: DC | PRN
  Administered 2017-09-22: 50 ug via INTRAVENOUS

## 2017-09-22 MED ORDER — DEXAMETHASONE SODIUM PHOSPHATE 4 MG/ML IJ SOLN
INTRAMUSCULAR | Status: DC | PRN
  Administered 2017-09-22: 10 mg via INTRAVENOUS

## 2017-09-22 MED ORDER — MIDAZOLAM HCL 2 MG/2ML IJ SOLN
1.0000 mg | INTRAMUSCULAR | Status: DC | PRN
  Administered 2017-09-22: 2 mg via INTRAVENOUS

## 2017-09-22 MED ORDER — BACITRACIN ZINC 500 UNIT/GM EX OINT
TOPICAL_OINTMENT | CUTANEOUS | Status: AC
  Filled 2017-09-22: qty 28.35

## 2017-09-22 MED ORDER — CELECOXIB 200 MG PO CAPS
ORAL_CAPSULE | ORAL | Status: AC
  Filled 2017-09-22: qty 1

## 2017-09-22 MED ORDER — PROPOFOL 10 MG/ML IV BOLUS
INTRAVENOUS | Status: DC | PRN
  Administered 2017-09-22: 170 mg via INTRAVENOUS

## 2017-09-22 MED ORDER — OXYMETAZOLINE HCL 0.05 % NA SOLN
NASAL | Status: AC
  Filled 2017-09-22: qty 15

## 2017-09-22 MED ORDER — ACETAMINOPHEN 500 MG PO TABS
1000.0000 mg | ORAL_TABLET | ORAL | Status: AC
  Administered 2017-09-22: 1000 mg via ORAL

## 2017-09-22 MED ORDER — LIDOCAINE 2% (20 MG/ML) 5 ML SYRINGE
INTRAMUSCULAR | Status: AC
  Filled 2017-09-22: qty 5

## 2017-09-22 MED ORDER — PROPOFOL 10 MG/ML IV BOLUS
INTRAVENOUS | Status: AC
  Filled 2017-09-22: qty 20

## 2017-09-22 MED ORDER — LACTATED RINGERS IV SOLN
INTRAVENOUS | Status: DC
  Administered 2017-09-22 (×2): via INTRAVENOUS

## 2017-09-22 MED ORDER — HYDROCODONE-ACETAMINOPHEN 5-325 MG PO TABS: 1.0000 | ORAL_TABLET | Freq: Four times a day (QID) | ORAL | 0 refills | Status: AC | PRN

## 2017-09-22 MED ORDER — SCOPOLAMINE 1 MG/3DAYS TD PT72: 1.0000 | MEDICATED_PATCH | Freq: Once | TRANSDERMAL | Status: DC | PRN

## 2017-09-22 MED ORDER — CELECOXIB 200 MG PO CAPS
200.0000 mg | ORAL_CAPSULE | ORAL | Status: AC
  Administered 2017-09-22: 200 mg via ORAL

## 2017-09-22 MED ORDER — CEFAZOLIN SODIUM-DEXTROSE 2-4 GM/100ML-% IV SOLN
2.0000 g | INTRAVENOUS | Status: AC
  Administered 2017-09-22: 2 g via INTRAVENOUS

## 2017-09-22 MED ORDER — LIDOCAINE 2% (20 MG/ML) 5 ML SYRINGE
INTRAMUSCULAR | Status: DC | PRN
  Administered 2017-09-22: 60 mg via INTRAVENOUS

## 2017-09-22 MED ORDER — BUPIVACAINE-EPINEPHRINE (PF) 0.5% -1:200000 IJ SOLN
INTRAMUSCULAR | Status: AC
  Filled 2017-09-22: qty 60

## 2017-09-22 MED ORDER — ONDANSETRON HCL 4 MG/2ML IJ SOLN
INTRAMUSCULAR | Status: AC
  Filled 2017-09-22: qty 2

## 2017-09-22 SURGICAL SUPPLY — 61 items
ADH SKN CLS APL DERMABOND .7 (GAUZE/BANDAGES/DRESSINGS) ×1
APPLIER CLIP 9.375 MED OPEN (MISCELLANEOUS)
BENZOIN TINCTURE PRP APPL 2/3 (GAUZE/BANDAGES/DRESSINGS) IMPLANT
BINDER BREAST LRG (GAUZE/BANDAGES/DRESSINGS) IMPLANT
BINDER BREAST MEDIUM (GAUZE/BANDAGES/DRESSINGS) ×3 IMPLANT
BINDER BREAST XLRG (GAUZE/BANDAGES/DRESSINGS) IMPLANT
BINDER BREAST XXLRG (GAUZE/BANDAGES/DRESSINGS) IMPLANT
BLADE HEX COATED 2.75 (ELECTRODE) ×3 IMPLANT
BLADE SURG 10 STRL SS (BLADE) IMPLANT
BLADE SURG 15 STRL LF DISP TIS (BLADE) ×1 IMPLANT
BLADE SURG 15 STRL SS (BLADE) ×3
CANISTER SUC SOCK COL 7IN (MISCELLANEOUS) IMPLANT
CANISTER SUCT 1200ML W/VALVE (MISCELLANEOUS) ×3 IMPLANT
CHLORAPREP W/TINT 26ML (MISCELLANEOUS) ×3 IMPLANT
CLIP APPLIE 9.375 MED OPEN (MISCELLANEOUS) IMPLANT
CLOSURE WOUND 1/2 X4 (GAUZE/BANDAGES/DRESSINGS)
COVER BACK TABLE 60X90IN (DRAPES) ×3 IMPLANT
COVER MAYO STAND STRL (DRAPES) ×3 IMPLANT
COVER PROBE W GEL 5X96 (DRAPES) ×3 IMPLANT
DECANTER SPIKE VIAL GLASS SM (MISCELLANEOUS) IMPLANT
DERMABOND ADVANCED (GAUZE/BANDAGES/DRESSINGS) ×2
DERMABOND ADVANCED .7 DNX12 (GAUZE/BANDAGES/DRESSINGS) ×1 IMPLANT
DEVICE DUBIN W/COMP PLATE 8390 (MISCELLANEOUS) ×3 IMPLANT
DRAPE LAPAROSCOPIC ABDOMINAL (DRAPES) ×3 IMPLANT
DRAPE UTILITY XL STRL (DRAPES) ×3 IMPLANT
DRSG PAD ABDOMINAL 8X10 ST (GAUZE/BANDAGES/DRESSINGS) ×3 IMPLANT
ELECT REM PT RETURN 9FT ADLT (ELECTROSURGICAL) ×3
ELECTRODE REM PT RTRN 9FT ADLT (ELECTROSURGICAL) ×1 IMPLANT
GAUZE SPONGE 4X4 12PLY STRL LF (GAUZE/BANDAGES/DRESSINGS) ×3 IMPLANT
GLOVE BIO SURGEON STRL SZ7 (GLOVE) ×6 IMPLANT
GLOVE EUDERMIC 7 POWDERFREE (GLOVE) ×6 IMPLANT
GOWN STRL REUS W/ TWL LRG LVL3 (GOWN DISPOSABLE) IMPLANT
GOWN STRL REUS W/ TWL XL LVL3 (GOWN DISPOSABLE) ×2 IMPLANT
GOWN STRL REUS W/TWL LRG LVL3 (GOWN DISPOSABLE)
GOWN STRL REUS W/TWL XL LVL3 (GOWN DISPOSABLE) ×4
ILLUMINATOR WAVEGUIDE N/F (MISCELLANEOUS) IMPLANT
KIT MARKER MARGIN INK (KITS) ×3 IMPLANT
LIGHT WAVEGUIDE WIDE FLAT (MISCELLANEOUS) IMPLANT
NEEDLE HYPO 25X1 1.5 SAFETY (NEEDLE) ×3 IMPLANT
NS IRRIG 1000ML POUR BTL (IV SOLUTION) ×3 IMPLANT
PACK BASIN DAY SURGERY FS (CUSTOM PROCEDURE TRAY) ×3 IMPLANT
PENCIL BUTTON HOLSTER BLD 10FT (ELECTRODE) ×3 IMPLANT
SHEET MEDIUM DRAPE 40X70 STRL (DRAPES) IMPLANT
SLEEVE SCD COMPRESS KNEE MED (MISCELLANEOUS) ×3 IMPLANT
SPONGE LAP 18X18 X RAY DECT (DISPOSABLE) IMPLANT
SPONGE LAP 4X18 X RAY DECT (DISPOSABLE) ×3 IMPLANT
STRIP CLOSURE SKIN 1/2X4 (GAUZE/BANDAGES/DRESSINGS) IMPLANT
SUT ETHILON 3 0 FSL (SUTURE) IMPLANT
SUT MNCRL AB 4-0 PS2 18 (SUTURE) ×3 IMPLANT
SUT SILK 2 0 SH (SUTURE) ×3 IMPLANT
SUT VIC AB 2-0 CT1 27 (SUTURE)
SUT VIC AB 2-0 CT1 TAPERPNT 27 (SUTURE) IMPLANT
SUT VIC AB 3-0 SH 27 (SUTURE)
SUT VIC AB 3-0 SH 27X BRD (SUTURE) IMPLANT
SUT VICRYL 3-0 CR8 SH (SUTURE) ×3 IMPLANT
SYR 10ML LL (SYRINGE) ×3 IMPLANT
TOWEL OR 17X24 6PK STRL BLUE (TOWEL DISPOSABLE) IMPLANT
TOWEL OR NON WOVEN STRL DISP B (DISPOSABLE) ×3 IMPLANT
TUBE CONNECTING 20'X1/4 (TUBING) ×1
TUBE CONNECTING 20X1/4 (TUBING) ×2 IMPLANT
YANKAUER SUCT BULB TIP NO VENT (SUCTIONS) ×3 IMPLANT

## 2017-09-22 NOTE — Op Note (Signed)
Patient Name:           Cindy Carlson   Date of Surgery:        09/22/2017  Pre op Diagnosis:      Papilloma right breast, 3:00 position                                      Family history of breast cancer in mother  Post op Diagnosis:    Same  Procedure:                 Right breast lumpectomy with radioactive seed localization and margin assessment  Surgeon:                     Edsel Petrin. Dalbert Batman, M.D., FACS  Assistant:                      OR staff  Operative Indications:   This is a very pleasant 46 year old female. Referred by Dr. Alvino Chapel at the breast center at [facility] for palpable papilloma right breast, 3 o'clock position. [name] is her gynecologist.     She has no prior history of breast problems. She recently noted a small palpable mass in the right breast at the 3 o'clock position. This has been present for about a month. A little bit tender. Her gynecologist agreed. Imaging studies were done including mammography and ultrasound. They saw a 7 mm mass in the right breast medially that correlated with the palpable mass. Ultrasound stated this was a 9 mm mass at the 3 o'clock position, 3 cm from the nipple. Axillary ultrasound the right was negative. Image guided biopsy shows papilloma. Fibrocystic change. [place]. She would like to have this area excised to clarify her diagnosis Family history reveals mother diagnosed with breast cancer age 60. Survivor. No ovarian cancer in the the family.       She'll be scheduled for right breast lumpectomy with radioactive seed localization. . She agrees with this plan.    Operative Findings:       There was a small palpable mass in the right breast at the 3:00 position.  This was no more than 2 cm medial to the areolar margin so I was able to make a circumareolar incision, a hidden scar technique.  Her breast are very small so even a very conservative excision took the excision down to the pectoralis fascia.   Intravenous dissection I lifted the skin up at the level of the subdermal area so the anterior margin is the skin.  The posterior margin is the pectoralis muscle.  All the margins were marked.  Specimen mammogram looked good showing the original biopsy clip and the radioactive seed present.  Procedure in Detail:          Following the induction of general LMA anesthesia the patient's right breast was prepped and draped in a sterile fashion, surgical timeout was performed, intravenous antibiotics were given, and local infiltration anesthetic with 0.5 Marcaine with epinephrine was utilized.     Using the neoprobe I identified the area of the radioactive seed which correlated with the palpable finding.  I chose to make a hidden scar technique, circumareolar incision was made at the areolar margin medially.  The skin and dermis were elevated medially off of the breast tissue.  The lumpectomy was performed using the neoprobe and electrocautery.  The  specimen was removed and marked with silk sutures and a 6 color ink kit to orient the pathologist.  The specimen mammogram looked good.  The specimen was marked and sent to the lab.  Hemostasis was excellent.  The wound was irrigated.  5 metal marker clips were placed in the walls of the lumpectomy cavity.  The breast tissues were closed with interrupted sutures of 3-0 Vicryl and the skin closed with a running subcuticular 4-0 Monocryl and Dermabond.  Breast binder was placed and the patient taken to PACU in stable condition.  EBL 10 mL.  Counts correct.  Complications none.     Edsel Petrin. Dalbert Batman, M.D., FACS General and Minimally Invasive Surgery Breast and Colorectal Surgery   Addendum: I logged onto the [facility] website and reviewed her prescription medication history.  09/22/2017 9:45 AM '

## 2017-09-22 NOTE — Interval H&P Note (Signed)
History and Physical Interval Note:  09/22/2017 7:50 AM  Cindy Carlson  has presented today for surgery, with the diagnosis of right breast papiloma  The various methods of treatment have been discussed with the patient and family. After consideration of risks, benefits and other options for treatment, the patient has consented to  Procedure(s): RIGHT BREAST LUMPECTOMY WITH RADIOACTIVE SEED LOCALIZATION ERAS PATHWAY (Right) as a surgical intervention .  The patient's history has been reviewed, patient examined, no change in status, stable for surgery.  I have reviewed the patient's chart and labs.  Questions were answered to the patient's satisfaction.     Adin Hector

## 2017-09-22 NOTE — Anesthesia Procedure Notes (Signed)
Procedure Name: LMA Insertion Date/Time: 09/22/2017 9:07 AM Performed by: Lyndee Leo, CRNA Pre-anesthesia Checklist: Patient identified, Emergency Drugs available, Suction available and Patient being monitored Patient Re-evaluated:Patient Re-evaluated prior to induction Oxygen Delivery Method: Circle system utilized Preoxygenation: Pre-oxygenation with 100% oxygen Induction Type: IV induction Ventilation: Mask ventilation without difficulty LMA: LMA inserted LMA Size: 3.0 Number of attempts: 1 Airway Equipment and Method: Bite block Placement Confirmation: positive ETCO2 Tube secured with: Tape Dental Injury: Teeth and Oropharynx as per pre-operative assessment

## 2017-09-22 NOTE — Discharge Instructions (Signed)
[facility] °Office Phone Number [phone] ° °BREAST BIOPSY/ PARTIAL MASTECTOMY: POST OP INSTRUCTIONS ° °Always review your discharge instruction sheet given to you by the facility where your surgery was performed. ° °IF YOU HAVE DISABILITY OR FAMILY LEAVE FORMS, YOU MUST BRING THEM TO THE OFFICE FOR PROCESSING.  DO NOT GIVE THEM TO YOUR DOCTOR. ° °1. A prescription for pain medication may be given to you upon discharge.  Take your pain medication as prescribed, if needed.  If narcotic pain medicine is not needed, then you may take acetaminophen (Tylenol) or ibuprofen (Advil) as needed. °2. Take your usually prescribed medications unless otherwise directed °3. If you need a refill on your pain medication, please contact your pharmacy.  They will contact our office to request authorization.  Prescriptions will not be filled after 5pm or on week-ends. °4. You should eat very light the first 24 hours after surgery, such as soup, crackers, pudding, etc.  Resume your normal diet the day after surgery. °5. Most patients will experience some swelling and bruising in the breast.  Ice packs and a good support bra will help.  Swelling and bruising can take several days to resolve.  °6. It is common to experience some constipation if taking pain medication after surgery.  Increasing fluid intake and taking a stool softener will usually help or prevent this problem from occurring.  A mild laxative (Milk of Magnesia or Miralax) should be taken according to package directions if there are no bowel movements after 48 hours. °7. Unless discharge instructions indicate otherwise, you may remove your bandages 24-48 hours after surgery, and you may shower at that time.  You may have steri-strips (small skin tapes) in place directly over the incision.  These strips should be left on the skin for 7-10 days.  If your surgeon used skin glue on the incision, you may shower in 24 hours.  The glue will flake off over the  next 2-3 weeks.  Any sutures or staples will be removed at the office during your follow-up visit. °8. ACTIVITIES:  You may resume regular daily activities (gradually increasing) beginning the next day.  Wearing a good support bra or sports bra minimizes pain and swelling.  You may have sexual intercourse when it is comfortable. °a. You may drive when you no longer are taking prescription pain medication, you can comfortably wear a seatbelt, and you can safely maneuver your car and apply brakes. °b. RETURN TO WORK:  ______________________________________________________________________________________ °9. You should see your doctor in the office for a follow-up appointment approximately two weeks after your surgery.  Your doctor’s nurse will typically make your follow-up appointment when she calls you with your pathology report.  Expect your pathology report 2-3 business days after your surgery.  You may call to check if you do not hear from us after three days. °10. OTHER INSTRUCTIONS: _______________________________________________________________________________________________ _____________________________________________________________________________________________________________________________________ °_____________________________________________________________________________________________________________________________________ °_____________________________________________________________________________________________________________________________________ ° °WHEN TO CALL YOUR DOCTOR: °1. Fever over 101.0 °2. Nausea and/or vomiting. °3. Extreme swelling or bruising. °4. Continued bleeding from incision. °5. Increased pain, redness, or drainage from the incision. ° °The clinic staff is available to answer your questions during regular business hours.  Please don’t hesitate to call and ask to speak to one of the nurses for clinical concerns.  If you have a medical emergency, go to the nearest  emergency room or call 911.  A surgeon from [facility] is always on call at the hospital. ° °For further questions, please visit [email or web address]  ° ° ° ° °  Post Anesthesia Home Care Instructions ° °Activity: °Get plenty of rest for the remainder of the day. A responsible individual must stay with you for 24 hours following the procedure.  °For the next 24 hours, DO NOT: °-Drive a car °-Operate machinery °-Drink alcoholic beverages °-Take any medication unless instructed by your physician °-Make any legal decisions or sign important papers. ° °Meals: °Start with liquid foods such as gelatin or soup. Progress to regular foods as tolerated. Avoid greasy, spicy, heavy foods. If nausea and/or vomiting occur, drink only clear liquids until the nausea and/or vomiting subsides. Call your physician if vomiting continues. ° °Special Instructions/Symptoms: °Your throat may feel dry or sore from the anesthesia or the breathing tube placed in your throat during surgery. If this causes discomfort, gargle with warm salt water. The discomfort should disappear within 24 hours. ° °If you had a scopolamine patch placed behind your ear for the management of post- operative nausea and/or vomiting: ° °1. The medication in the patch is effective for 72 hours, after which it should be removed.  Wrap patch in a tissue and discard in the trash. Wash hands thoroughly with soap and water. °2. You may remove the patch earlier than 72 hours if you experience unpleasant side effects which may include dry mouth, dizziness or visual disturbances. °3. Avoid touching the patch. Wash your hands with soap and water after contact with the patch. °  ° °

## 2017-09-22 NOTE — Transfer of Care (Signed)
Immediate Anesthesia Transfer of Care Note  Patient: Cindy Carlson  Procedure(s) Performed: RIGHT BREAST LUMPECTOMY WITH RADIOACTIVE SEED LOCALIZATION ERAS PATHWAY (Right Breast)  Patient Location: PACU  Anesthesia Type:General  Level of Consciousness: awake, sedated and patient cooperative  Airway & Oxygen Therapy: Patient Spontanous Breathing  Post-op Assessment: Report given to RN and Post -op Vital signs reviewed and stable  Post vital signs: Reviewed and stable  Last Vitals:  Vitals:   09/22/17 0811  BP: 103/66  Pulse: 99  Resp: 16  Temp: 36.8 C  SpO2: 100%    Last Pain:  Vitals:   09/22/17 0811  TempSrc: Oral         Complications: No apparent anesthesia complications

## 2017-09-22 NOTE — Anesthesia Postprocedure Evaluation (Signed)
Anesthesia Post Note  Patient: Cindy Carlson  Procedure(s) Performed: RIGHT BREAST LUMPECTOMY WITH RADIOACTIVE SEED LOCALIZATION ERAS PATHWAY (Right Breast)     Patient location during evaluation: PACU Anesthesia Type: General Level of consciousness: awake Pain management: pain level controlled Vital Signs Assessment: post-procedure vital signs reviewed and stable Respiratory status: spontaneous breathing Cardiovascular status: stable Anesthetic complications: no    Last Vitals:  Vitals:   09/22/17 1030 09/22/17 1045  BP: 105/69 99/70  Pulse: (!) 57 (!) 57  Resp: 14 13  Temp:    SpO2: 100% 100%    Last Pain:  Vitals:   09/22/17 1015  TempSrc:   PainSc: 0-No pain                 Channing Yeager

## 2017-09-22 NOTE — Anesthesia Preprocedure Evaluation (Addendum)
Anesthesia Evaluation  Patient identified by MRN, date of birth, ID band Patient awake    Reviewed: Allergy & Precautions, NPO status , Patient's Chart, lab work & pertinent test results  Airway Mallampati: II  TM Distance: >3 FB     Dental   Pulmonary neg pulmonary ROS,    breath sounds clear to auscultation       Cardiovascular negative cardio ROS   Rhythm:Regular Rate:Normal     Neuro/Psych    GI/Hepatic negative GI ROS, Neg liver ROS,   Endo/Other  negative endocrine ROS  Renal/GU negative Renal ROS     Musculoskeletal   Abdominal   Peds  Hematology   Anesthesia Other Findings   Reproductive/Obstetrics                             Anesthesia Physical Anesthesia Plan  ASA: I  Anesthesia Plan: General   Post-op Pain Management:    Induction: Intravenous  PONV Risk Score and Plan: 3 and Ondansetron, Dexamethasone and Midazolam  Airway Management Planned: LMA  Additional Equipment:   Intra-op Plan:   Post-operative Plan: Extubation in OR  Informed Consent: I have reviewed the patients History and Physical, chart, labs and discussed the procedure including the risks, benefits and alternatives for the proposed anesthesia with the patient or authorized representative who has indicated his/her understanding and acceptance.   Dental advisory given  Plan Discussed with: CRNA and Anesthesiologist  Anesthesia Plan Comments:         Anesthesia Quick Evaluation

## 2017-09-25 ENCOUNTER — Encounter (HOSPITAL_BASED_OUTPATIENT_CLINIC_OR_DEPARTMENT_OTHER): Payer: Self-pay | Admitting: General Surgery

## 2017-09-26 NOTE — Progress Notes (Signed)
Inform patient of Pathology report,. Breast pathology shows NO CANCER. Only papilloma and hyperplasia. Will discuss in detail at next OV.  hmi

## 2017-10-05 ENCOUNTER — Telehealth: Payer: Self-pay | Admitting: Obstetrics and Gynecology

## 2017-10-05 NOTE — Telephone Encounter (Signed)
Called to discuss rash that she developed after surgery last week.   States she called Psychologist, sport and exercise and was told take benadryl and zyrtec. Went to her PCP and was given prednisone, which has caused her incision to seep. She is now having chest pain in the middle of her chest. Prednisone cleared the hives on her neck. The hives are still on the palms of her hands and soles of her feet.   She has no trouble breathing.  Her main concern is her ongoing rash and itch and that something else is going on.  I encouraged her to continue to stay in contact with her surgeon for ongoing guidance around the time of her surgery.  If she develops trouble breathing or worsening chest pain, she should go directly to the ER. She voiced understanding and agreement.    [name], MD 10/05/2017 5:27 PM
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
## 2017-11-20 ENCOUNTER — Other Ambulatory Visit: Payer: Self-pay | Admitting: Obstetrics and Gynecology

## 2017-11-20 DIAGNOSIS — J111 Influenza due to unidentified influenza virus with other respiratory manifestations: Secondary | ICD-10-CM

## 2017-11-20 MED ORDER — OSELTAMIVIR PHOSPHATE 75 MG PO CAPS: 75.0000 mg | ORAL_CAPSULE | Freq: Two times a day (BID) | ORAL | 0 refills | Status: AC

## 2019-03-15 ENCOUNTER — Ambulatory Visit: Payer: Commercial Managed Care - PPO | Admitting: Obstetrics and Gynecology

## 2019-05-13 ENCOUNTER — Telehealth: Payer: Self-pay

## 2019-05-13 NOTE — Telephone Encounter (Signed)
Pt has apt scheduled for Mammogram 05/18/2019 at 11am. She is requesting her past 5 yrs of mammo results to be faxed to [phone]. [phone]

## 2019-05-26 IMAGING — US US BREAST*R* LIMITED INC AXILLA
1 series · 10 of 10 positions shown · non-contrast
Comparison: Previous exam(s).

CLINICAL DATA: 45-year-old female with a right breast palpable
abnormality.

EXAM:
2D DIGITAL DIAGNOSTIC UNILATERAL RIGHT MAMMOGRAM WITH CAD AND
ADJUNCT TOMO
RIGHT BREAST ULTRASOUND

[Series 1: us breast*right* limited inc axilla · 0.06mm/px · 10 of 10 slices shown]
[im 1/10]
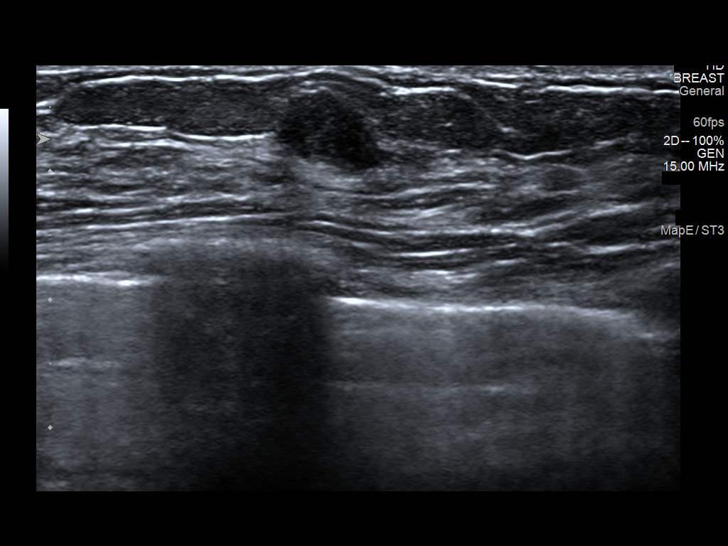
[im 2/10]
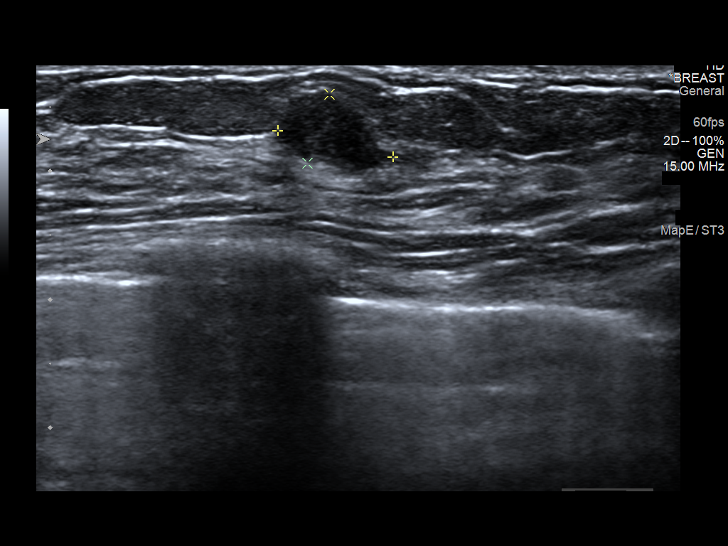
[im 3/10]
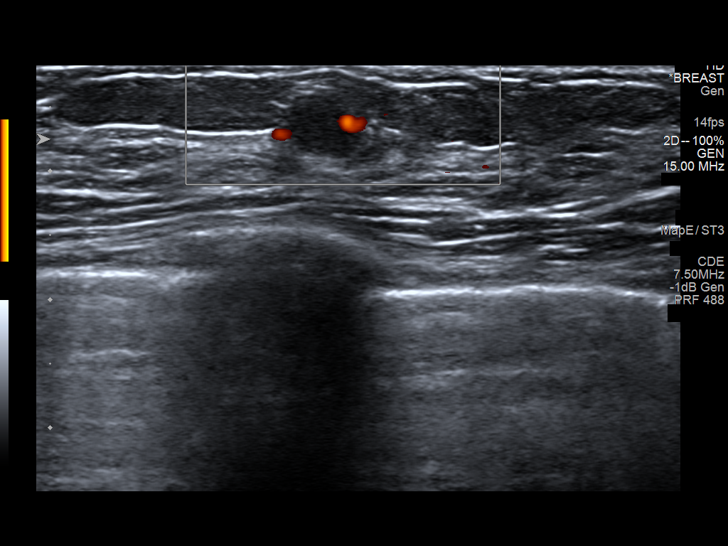
[im 4/10]
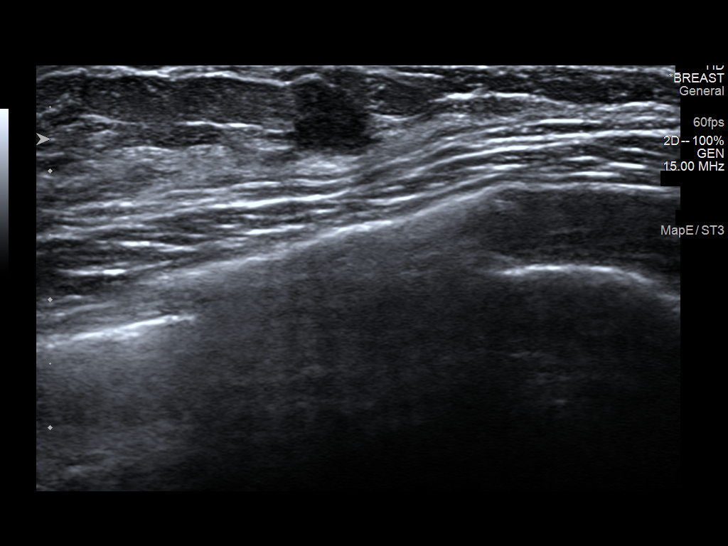
[im 5/10]
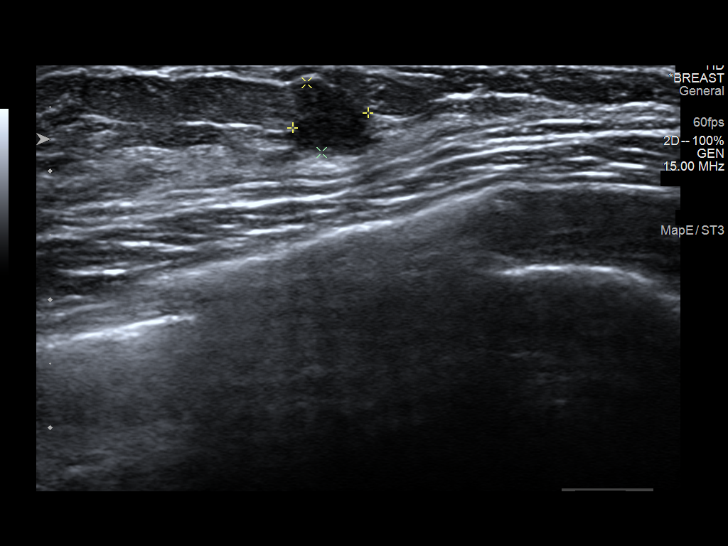
[im 6/10]
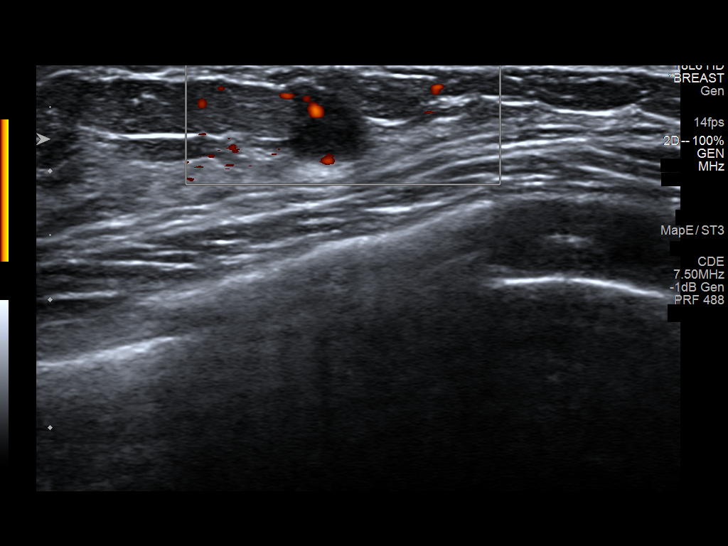
[im 7/10]
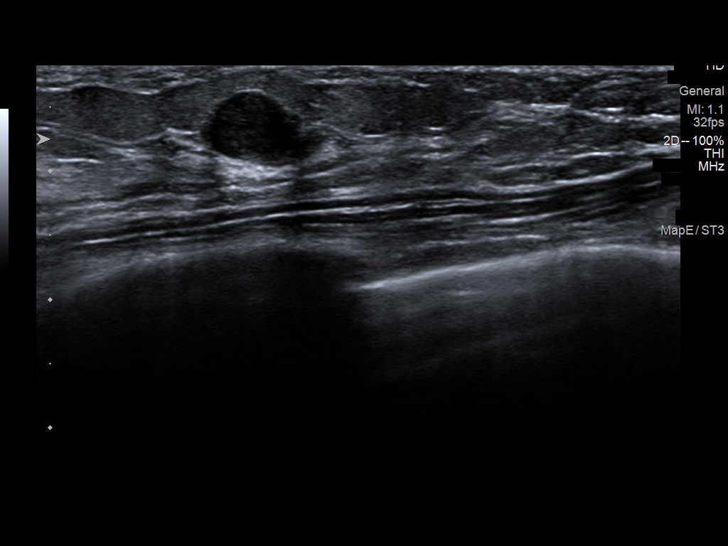
[im 8/10]
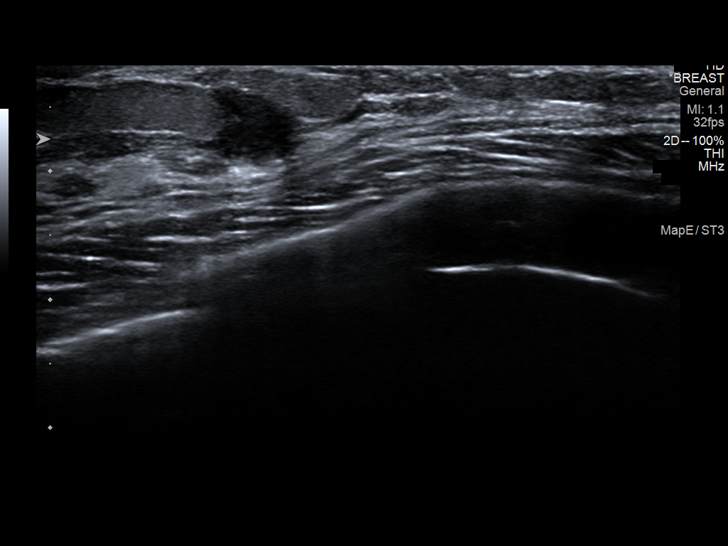
[im 9/10]
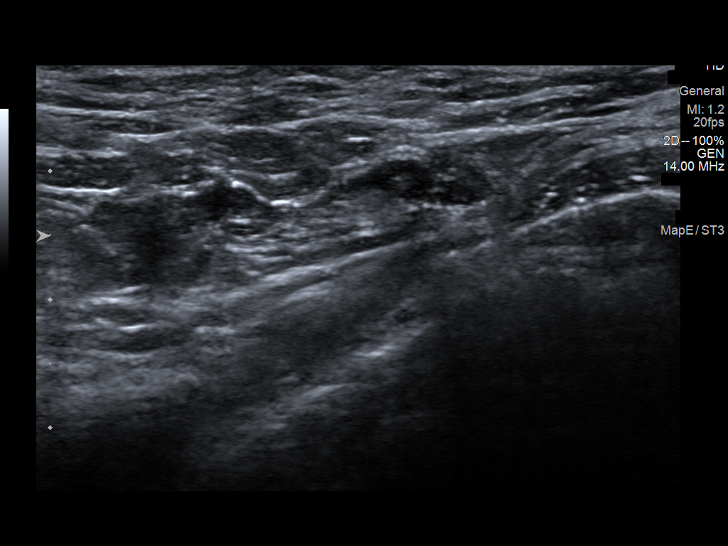
[im 10/10]
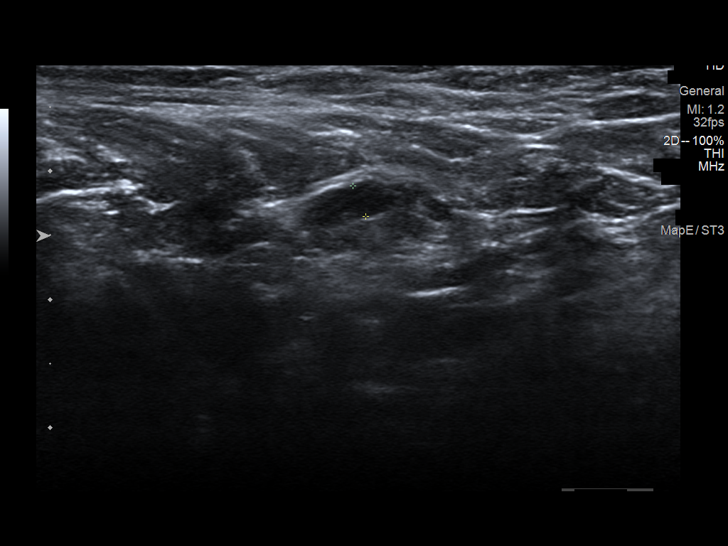

[10 of 10 positions shown; findings below may reference images not displayed]

ACR Breast Density Category d: The breast tissue is extremely dense,
which lowers the sensitivity of mammography.
FINDINGS: Cc and MLO tomograms as well as spot compression tomograms were
performed of the right breast demonstrating a mass with slightly
irregular margins in the inner right breast measuring approximately
7 mm.

Mammographic images were processed with CAD.

Physical examination of the inner right breast reveals a small firm
mass.

Targeted ultrasound of the right breast was performed demonstrating
a hypoechoic mass with slight margin irregularity at 3 o'clock 2-3
cm from nipple measuring 0.9 x 0.6 x 0.6 cm. This corresponds well
with the mass seen at mammography. No lymphadenopathy seen in the
right axilla.
IMPRESSION: Suspicious right breast mass.

RECOMMENDATION:
Ultrasound-guided biopsy of the mass in the inner right breast is
recommended. This is being scheduled for the patient.

I have discussed the findings and recommendations with the patient.
Results were also provided in writing at the conclusion of the
visit. If applicable, a reminder letter will be sent to the patient
regarding the next appointment.

BI-RADS CATEGORY  4: Suspicious.
